# Patient Record
Sex: Male | Born: 1970 | Race: Black or African American | Hispanic: No | Marital: Single | Smoking: Current every day smoker
Health system: Southern US, Community
[De-identification: ages and names within clinical notes are randomized; demographics above are authoritative.]

## PROBLEM LIST (undated history)

## (undated) DIAGNOSIS — F419 Anxiety disorder, unspecified: Secondary | ICD-10-CM

## (undated) HISTORY — PX: NO PAST SURGERIES: SHX2092

---

## 2006-03-23 ENCOUNTER — Emergency Department (HOSPITAL_COMMUNITY): Admission: EM | Admit: 2006-03-23 | Discharge: 2006-03-23 | Payer: Self-pay | Admitting: Emergency Medicine

## 2010-08-26 ENCOUNTER — Ambulatory Visit: Payer: Self-pay | Admitting: Physical Therapy

## 2010-09-04 ENCOUNTER — Ambulatory Visit (HOSPITAL_BASED_OUTPATIENT_CLINIC_OR_DEPARTMENT_OTHER): Payer: BC Managed Care – PPO | Admitting: Psychology

## 2010-09-04 DIAGNOSIS — F332 Major depressive disorder, recurrent severe without psychotic features: Secondary | ICD-10-CM

## 2010-09-04 DIAGNOSIS — F4010 Social phobia, unspecified: Secondary | ICD-10-CM

## 2010-09-11 ENCOUNTER — Encounter (HOSPITAL_BASED_OUTPATIENT_CLINIC_OR_DEPARTMENT_OTHER): Payer: BC Managed Care – PPO | Admitting: Psychology

## 2010-09-11 DIAGNOSIS — F332 Major depressive disorder, recurrent severe without psychotic features: Secondary | ICD-10-CM

## 2010-09-11 DIAGNOSIS — F4010 Social phobia, unspecified: Secondary | ICD-10-CM

## 2010-11-02 ENCOUNTER — Ambulatory Visit (HOSPITAL_COMMUNITY): Payer: BC Managed Care – PPO | Admitting: Psychiatry

## 2010-12-11 ENCOUNTER — Ambulatory Visit (INDEPENDENT_AMBULATORY_CARE_PROVIDER_SITE_OTHER): Payer: BC Managed Care – PPO | Admitting: Psychiatry

## 2010-12-11 DIAGNOSIS — F331 Major depressive disorder, recurrent, moderate: Secondary | ICD-10-CM
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
## 2010-12-28 ENCOUNTER — Encounter (INDEPENDENT_AMBULATORY_CARE_PROVIDER_SITE_OTHER): Payer: BC Managed Care – PPO | Admitting: Psychiatry

## 2010-12-28 DIAGNOSIS — F331 Major depressive disorder, recurrent, moderate: Secondary | ICD-10-CM

## 2011-01-18 ENCOUNTER — Encounter (INDEPENDENT_AMBULATORY_CARE_PROVIDER_SITE_OTHER): Payer: BC Managed Care – PPO | Admitting: Psychiatry

## 2011-01-18 ENCOUNTER — Ambulatory Visit (HOSPITAL_COMMUNITY)
Admission: RE | Admit: 2011-01-18 | Discharge: 2011-01-18 | Disposition: A | Discharge status: Home / Self Care | Payer: BC Managed Care – PPO | Attending: Psychiatry | Admitting: Psychiatry

## 2011-01-18 DIAGNOSIS — F329 Major depressive disorder, single episode, unspecified: Secondary | ICD-10-CM

## 2011-01-18 DIAGNOSIS — F339 Major depressive disorder, recurrent, unspecified: Secondary | ICD-10-CM | POA: Insufficient documentation

## 2011-01-19 ENCOUNTER — Other Ambulatory Visit (HOSPITAL_COMMUNITY): Payer: BC Managed Care – PPO

## 2011-01-20 ENCOUNTER — Other Ambulatory Visit (HOSPITAL_COMMUNITY): Payer: BC Managed Care – PPO | Attending: Psychiatry

## 2011-01-20 DIAGNOSIS — F339 Major depressive disorder, recurrent, unspecified: Secondary | ICD-10-CM | POA: Insufficient documentation

## 2011-01-20 DIAGNOSIS — F411 Generalized anxiety disorder: Secondary | ICD-10-CM | POA: Insufficient documentation

## 2011-01-20 DIAGNOSIS — F909 Attention-deficit hyperactivity disorder, unspecified type: Secondary | ICD-10-CM

## 2011-01-20 DIAGNOSIS — F4310 Post-traumatic stress disorder, unspecified: Secondary | ICD-10-CM

## 2011-01-20 DIAGNOSIS — F341 Dysthymic disorder: Secondary | ICD-10-CM

## 2011-01-21 ENCOUNTER — Other Ambulatory Visit (HOSPITAL_COMMUNITY): Payer: BC Managed Care – PPO

## 2011-01-22 ENCOUNTER — Other Ambulatory Visit (HOSPITAL_COMMUNITY): Payer: BC Managed Care – PPO

## 2011-01-25 ENCOUNTER — Other Ambulatory Visit (HOSPITAL_COMMUNITY): Payer: BC Managed Care – PPO

## 2011-01-26 ENCOUNTER — Other Ambulatory Visit (HOSPITAL_BASED_OUTPATIENT_CLINIC_OR_DEPARTMENT_OTHER): Payer: BC Managed Care – PPO

## 2011-01-26 DIAGNOSIS — F329 Major depressive disorder, single episode, unspecified: Secondary | ICD-10-CM

## 2011-01-27 ENCOUNTER — Other Ambulatory Visit (HOSPITAL_COMMUNITY): Payer: BC Managed Care – PPO

## 2011-01-28 ENCOUNTER — Other Ambulatory Visit (HOSPITAL_COMMUNITY): Payer: BC Managed Care – PPO

## 2011-01-29 ENCOUNTER — Other Ambulatory Visit (HOSPITAL_COMMUNITY): Payer: BC Managed Care – PPO

## 2011-02-01 ENCOUNTER — Other Ambulatory Visit (HOSPITAL_COMMUNITY): Payer: BC Managed Care – PPO

## 2011-02-02 ENCOUNTER — Other Ambulatory Visit (HOSPITAL_COMMUNITY): Payer: BC Managed Care – PPO

## 2011-02-03 ENCOUNTER — Other Ambulatory Visit (HOSPITAL_COMMUNITY): Payer: BC Managed Care – PPO

## 2011-02-04 ENCOUNTER — Other Ambulatory Visit (HOSPITAL_COMMUNITY): Payer: BC Managed Care – PPO | Attending: Psychiatry

## 2011-02-04 DIAGNOSIS — F339 Major depressive disorder, recurrent, unspecified: Secondary | ICD-10-CM | POA: Insufficient documentation

## 2011-02-04 DIAGNOSIS — F411 Generalized anxiety disorder: Secondary | ICD-10-CM | POA: Insufficient documentation

## 2011-02-05 ENCOUNTER — Other Ambulatory Visit (HOSPITAL_COMMUNITY): Payer: BC Managed Care – PPO

## 2011-02-08 ENCOUNTER — Other Ambulatory Visit (HOSPITAL_COMMUNITY): Payer: BC Managed Care – PPO

## 2011-02-09 ENCOUNTER — Other Ambulatory Visit (HOSPITAL_COMMUNITY): Payer: BC Managed Care – PPO

## 2011-02-10 ENCOUNTER — Other Ambulatory Visit (HOSPITAL_COMMUNITY): Payer: BC Managed Care – PPO

## 2011-02-11 ENCOUNTER — Other Ambulatory Visit (HOSPITAL_COMMUNITY): Payer: BC Managed Care – PPO

## 2011-02-23 ENCOUNTER — Other Ambulatory Visit (HOSPITAL_COMMUNITY): Payer: Self-pay

## 2011-03-01 ENCOUNTER — Encounter (HOSPITAL_COMMUNITY): Payer: Self-pay | Admitting: Psychiatry

## 2011-03-01 ENCOUNTER — Ambulatory Visit (INDEPENDENT_AMBULATORY_CARE_PROVIDER_SITE_OTHER): Payer: BC Managed Care – PPO | Admitting: Psychiatry

## 2011-03-01 DIAGNOSIS — F329 Major depressive disorder, single episode, unspecified: Secondary | ICD-10-CM

## 2011-03-01 MED ORDER — BUPROPION HCL ER (SR) 150 MG PO TB12: 150.0000 mg | ORAL_TABLET | Freq: Two times a day (BID) | ORAL | Status: DC

## 2011-03-01 NOTE — Progress Notes (Signed)
Patient came for his followup appointment. He has recently finished intensive outpatient program. His medication has changed now he is taking Wellbutrin. He never started Prozac. He has shown some improvement but he continued to endorse decreased energy poor concentration and some depressive thoughts. He is sleeping better. He will his thought seeing therapist in this office tomorrow for individual counseling. He reported no side effects of Wellbutrin. He reported no agitation no anger or any hallucination. He wants to try higher dose of Wellbutrin. So far he had tried Cymbalta with limited response. He started to be more social and active in his life.  Mental status examination Patient is casually dressed with fair eye contact. His speech is clear and coherent. His thought process is slow but logical linear and goal-directed. He denies any active or passive suicidal thinking. There no psychotic symptoms present. His attention and concentration is fair. He's alert and oriented x3. The  Assessment Mood disorder NOS rule out Maj. depressive disorder  Plan I will increase his Wellbutrin to 150 twice a day. I explained the risk and benefits of medication in detail. She will begin his therapy with Forde Radon tomorrow. I reinforced safety plan. I will see him again in 2 months.

## 2011-03-02 ENCOUNTER — Ambulatory Visit (INDEPENDENT_AMBULATORY_CARE_PROVIDER_SITE_OTHER): Payer: BC Managed Care – PPO | Admitting: Psychology

## 2011-03-02 DIAGNOSIS — F4010 Social phobia, unspecified: Secondary | ICD-10-CM

## 2011-03-02 DIAGNOSIS — F330 Major depressive disorder, recurrent, mild: Secondary | ICD-10-CM

## 2011-03-02 NOTE — Progress Notes (Signed)
   THERAPIST PROGRESS NOTE  Session Time: 9.35am-10:20am  Participation Level: Active  Behavioral Response: Well GroomedAlertEuthymic  Type of Therapy: Individual Therapy  Treatment Goals addressed: Diagnosis: MDD, Social Anxiety and goals 1 &2.    Interventions: CBT and Strength-based  Summary: Clayton Shannon is a 40 y.o. male who presents with anxiety and depression.  Pt reported he received a lot of benefit in IOP as was able to feel free expressing things from past and getting things out that he felt he needed.  Pt however reports that he continues to struggle w/ anxiety, depression, relationships, motivation and accomplishing his goals.  Pt reported he has yet to resolve a speeding ticket for the past 1.29months in order to resolve his suspended drivers license.  Pt acknowledge some cognitive distortions of "doing nothing" and challenged some w/ identifying things he has done. Pt also created awareness that when he starts something he does in excess and not consistent w/ his identified enjoyment in change in routine. Pt for hw agreed to make contact w/ lawyers to resolve tickets and bring in materials/resources received from IOP.   Suicidal/Homicidal: Nowithout intent/plan  Therapist Response: Assessed pt current functioning per his report.  Processed w/ pt his IOP tx and benefits gained and wants for f/u tx at this time.  Explored w/ pt barriers to making change and introduced to cognitive distortions and gave pt hw to bring in materials from IOP as he reported some information on already.  Processed w/ pt cognitive distortions of all or nothing thinking and assisted in challenging in session.  Assisted pt in identifying hw to commit to actions for improvements.   Plan: Return again in 1-2 weeks.  Diagnosis: Axis I: Major Depression, Recurrent mild and Social Anxiety    Axis II: Deferred    Forde Radon, [facility] 03/02/2011

## 2011-03-16 ENCOUNTER — Ambulatory Visit (HOSPITAL_COMMUNITY): Payer: BC Managed Care – PPO | Admitting: Psychology

## 2011-03-16 DIAGNOSIS — F331 Major depressive disorder, recurrent, moderate: Secondary | ICD-10-CM

## 2011-03-16 DIAGNOSIS — F4010 Social phobia, unspecified: Secondary | ICD-10-CM

## 2011-04-26 ENCOUNTER — Ambulatory Visit (INDEPENDENT_AMBULATORY_CARE_PROVIDER_SITE_OTHER): Payer: BC Managed Care – PPO | Admitting: Psychiatry

## 2011-04-26 ENCOUNTER — Encounter (HOSPITAL_COMMUNITY): Payer: Self-pay | Admitting: Psychiatry

## 2011-04-26 VITALS — Wt 234.0 lb

## 2011-04-26 DIAGNOSIS — F329 Major depressive disorder, single episode, unspecified: Secondary | ICD-10-CM

## 2011-04-26 MED ORDER — FLUOXETINE HCL 10 MG PO CAPS: ORAL_CAPSULE | ORAL | Status: DC
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
## 2011-05-03 ENCOUNTER — Ambulatory Visit (HOSPITAL_COMMUNITY): Payer: BC Managed Care – PPO | Admitting: Psychiatry

## 2011-05-04 ENCOUNTER — Ambulatory Visit (INDEPENDENT_AMBULATORY_CARE_PROVIDER_SITE_OTHER): Payer: BC Managed Care – PPO | Admitting: Psychology

## 2011-05-04 DIAGNOSIS — F331 Major depressive disorder, recurrent, moderate: Secondary | ICD-10-CM

## 2011-05-04 DIAGNOSIS — F4310 Post-traumatic stress disorder, unspecified: Secondary | ICD-10-CM

## 2011-05-04 NOTE — Progress Notes (Signed)
   THERAPIST PROGRESS NOTE  Session Time: 1:05pm-1:55pm  Participation Level: Active  Behavioral Response: Well GroomedAlertDepressed  Type of Therapy: Individual Therapy  Treatment Goals addressed: Diagnosis: MDD and PTSD  Interventions: CBT and Strength-based  Summary: Clayton Shannon is a 41 y.o. male who presents with reported depressed mood and anxiety.  Pt reported that he has recently become more depressed again and wanting to f/u w/ tx.  Pt denies any SI.  He reports continuing to struggle w/ low self confidence, social anxiety and avoidance.  Pt is able to identify some cognitve distortions in session and talk about how he is attempting to use positive reframes.  Pt gained insight into PTSD symptoms reporting intrusive recollections w/ avoidance in relationships. Pt feels he needs concrete insturctions for making changes and be able to see these visually.  Pt aggreed to document and chart use of positive self statements till next session.  Suicidal/Homicidal: Nowithout intent/plan  Therapist Response: Assessed pt current functioning per his report.  Processed w/ pt depressive and anxious feelings and connections of thoughts and beliefs.  Discussed the process of change, effective treatments for depression and anxiety and pt role in tx.  Encouraged pt charting and documentation of agreed positive self statements daily.  Plan: Return again in 2 weeks.  Diagnosis: Axis I: Major Depression, Recurrent  and Post Traumatic Stress Disorder    Axis II: No diagnosis    Kaity Pitstick, LPC 05/04/2011

## 2011-05-18 ENCOUNTER — Ambulatory Visit (HOSPITAL_COMMUNITY): Payer: BC Managed Care – PPO | Admitting: Psychology

## 2011-07-22 ENCOUNTER — Encounter (HOSPITAL_COMMUNITY): Payer: Self-pay | Admitting: Psychology

## 2011-07-22 DIAGNOSIS — F331 Major depressive disorder, recurrent, moderate: Secondary | ICD-10-CM

## 2011-07-22 NOTE — Progress Notes (Signed)
Outpatient Therapist Discharge Summary  Clayton Shannon    May 20, 1970   Admission Date: 6/112   Discharge Date:  07/22/11 Reason for Discharge:  Not active- no shows Diagnosis:  Axis I:   1. Major depressive disorder, recurrent episode, moderate     Axis II:  v71.09  Axis III:  none  Axis IV:  Primary supports, occupational, social  Axis V:  Unknown on d/c  Comments:  Pt last attended tx on 05/04/11- no show for f/u  Forde Radon

## 2014-11-22 ENCOUNTER — Encounter (HOSPITAL_COMMUNITY): Payer: Self-pay

## 2014-11-22 ENCOUNTER — Emergency Department (HOSPITAL_COMMUNITY)
Admission: EM | Admit: 2014-11-22 | Discharge: 2014-11-22 | Disposition: A | Discharge status: Home / Self Care | Payer: Managed Care, Other (non HMO) | Source: Home / Self Care | Attending: Emergency Medicine | Admitting: Emergency Medicine

## 2014-11-22 ENCOUNTER — Other Ambulatory Visit (HOSPITAL_COMMUNITY)
Admission: RE | Admit: 2014-11-22 | Discharge: 2014-11-22 | Disposition: A | Discharge status: Home / Self Care | Payer: Managed Care, Other (non HMO) | Source: Ambulatory Visit | Attending: Emergency Medicine | Admitting: Emergency Medicine

## 2014-11-22 DIAGNOSIS — R300 Dysuria: Secondary | ICD-10-CM | POA: Diagnosis not present

## 2014-11-22 DIAGNOSIS — Z113 Encounter for screening for infections with a predominantly sexual mode of transmission: Secondary | ICD-10-CM | POA: Insufficient documentation

## 2014-11-22 DIAGNOSIS — R1011 Right upper quadrant pain: Secondary | ICD-10-CM

## 2014-11-22 DIAGNOSIS — R1010 Upper abdominal pain, unspecified: Secondary | ICD-10-CM | POA: Diagnosis not present

## 2014-11-22 LAB — COMPREHENSIVE METABOLIC PANEL
ALT: 16 U/L — ABNORMAL LOW (ref 17–63)
ANION GAP: 8 (ref 5–15)
AST: 18 U/L (ref 15–41)
Albumin: 4.4 g/dL (ref 3.5–5.0)
Alkaline Phosphatase: 38 U/L (ref 38–126)
BILIRUBIN TOTAL: 0.5 mg/dL (ref 0.3–1.2)
BUN: 12 mg/dL (ref 6–20)
CHLORIDE: 104 mmol/L (ref 101–111)
CO2: 27 mmol/L (ref 22–32)
Calcium: 9.7 mg/dL (ref 8.9–10.3)
Creatinine, Ser: 1.24 mg/dL (ref 0.61–1.24)
GFR calc non Af Amer: >60 mL/min (ref >60)
Glucose, Bld: 113 mg/dL — ABNORMAL HIGH (ref 65–99)
POTASSIUM: 4.2 mmol/L (ref 3.5–5.1)
Sodium: 139 mmol/L (ref 135–145)
TOTAL PROTEIN: 7.6 g/dL (ref 6.5–8.1)

## 2014-11-22 LAB — CBC
HCT: 41.1 % (ref 39.0–52.0)
Hemoglobin: 14.1 g/dL (ref 13.0–17.0)
MCH: 30.5 pg (ref 26.0–34.0)
MCHC: 34.3 g/dL (ref 30.0–36.0)
MCV: 89 fL (ref 78.0–100.0)
PLATELETS: 215 10*3/uL (ref 150–400)
RBC: 4.62 MIL/uL (ref 4.22–5.81)
RDW: 12.5 % (ref 11.5–15.5)
WBC: 6.2 10*3/uL (ref 4.0–10.5)

## 2014-11-22 LAB — POCT URINALYSIS DIP (DEVICE)
BILIRUBIN URINE: NEGATIVE
GLUCOSE, UA: NEGATIVE mg/dL
Ketones, ur: NEGATIVE mg/dL
NITRITE: NEGATIVE
Protein, ur: NEGATIVE mg/dL
Specific Gravity, Urine: 1.02 (ref 1.005–1.030)
UROBILINOGEN UA: 0.2 mg/dL (ref 0.0–1.0)
pH: 7 (ref 5.0–8.0)

## 2014-11-22 MED ORDER — CEFTRIAXONE SODIUM 250 MG IJ SOLR
INTRAMUSCULAR | Status: AC
  Filled 2014-11-22: qty 250

## 2014-11-22 MED ORDER — LIDOCAINE HCL (PF) 1 % IJ SOLN
INTRAMUSCULAR | Status: AC
  Filled 2014-11-22: qty 5

## 2014-11-22 MED ORDER — AZITHROMYCIN 250 MG PO TABS
1000.0000 mg | ORAL_TABLET | Freq: Once | ORAL | Status: AC
  Administered 2014-11-22: 1000 mg via ORAL

## 2014-11-22 MED ORDER — CEFTRIAXONE SODIUM 250 MG IJ SOLR
250.0000 mg | Freq: Once | INTRAMUSCULAR | Status: AC
  Administered 2014-11-22: 250 mg via INTRAMUSCULAR

## 2014-11-22 MED ORDER — AZITHROMYCIN 250 MG PO TABS
ORAL_TABLET | ORAL | Status: AC
  Filled 2014-11-22: qty 4

## 2014-11-22 NOTE — ED Provider Notes (Signed)
CSN: [id number]     Arrival date & time 11/22/14  1619 History   First MD Initiated Contact with Patient 11/22/14 1712     Chief Complaint  Patient presents with  . Abdominal Pain   (Consider location/radiation/quality/duration/timing/severity/associated sxs/prior Treatment) HPI He is a 44 year old man here for evaluation of right upper quadrant abdominal discomfort as well as a tingling sensation in his penis. The tingling sensations started about a week ago. He denies any discharge or dysuria. He did have unprotected sex with a new partner about one month ago. The right upper quadrant abdominal discomfort is described as a pressure sensation. It has been present for 5 years. It is constant. It is not worsening. He was evaluated by a physician for this several years ago, but states no blood work or imaging was done. He is a current smoker. He drinks one or 2 beers on the weekend. He denies any history of drug use. No history of blood transfusions.  History reviewed. No pertinent past medical history. History reviewed. No pertinent past surgical history. History reviewed. No pertinent family history. Social History  Substance Use Topics  . Smoking status: Current Every Day Smoker -- 0.50 packs/day for 25 years    Types: Cigarettes  . Smokeless tobacco: None  . Alcohol Use: No    Review of Systems As in history of present illness Allergies  Review of patient's allergies indicates no known allergies.  Home Medications   Prior to Admission medications   Medication Sig Start Date End Date Taking? Authorizing Provider  FLUoxetine (PROZAC) 10 MG capsule Take 1 cap every day for one week and than 2 cap a day 04/26/11   Cleotis Nipper, MD   BP 129/77 mmHg  Pulse 83  Temp(Src) 98.4 F (36.9 C) (Oral)  Resp 16  SpO2 97% Physical Exam  Constitutional: He is oriented to person, place, and time. He appears well-developed and well-nourished. No distress.  Neck: Neck supple.  Cardiovascular:  Normal rate.   Pulmonary/Chest: Effort normal.  Abdominal: Soft. Bowel sounds are normal. He exhibits no distension and no mass. There is no tenderness. There is no rebound and no guarding.  No hepatomegaly  Neurological: He is alert and oriented to person, place, and time.    ED Course  Procedures (including critical care time) Labs Review Labs Reviewed  POCT URINALYSIS DIP (DEVICE) - Abnormal; Notable for the following:    Hgb urine dipstick TRACE (*)    Leukocytes, UA SMALL (*)    All other components within normal limits  CBC  COMPREHENSIVE METABOLIC PANEL  HIV ANTIBODY (ROUTINE TESTING)  RPR  HEPATITIS PANEL, ACUTE  URINE CYTOLOGY ANCILLARY ONLY    Imaging Review No results found.   MDM   1. RUQ discomfort   2. Dysuria    Blood and urine collected. We will call with any positive results. Treated presumptively with Rocephin 250 mg IM and azithromycin 1 g by mouth. Right upper quadrant discomfort may be due to liver pathology. CMP and hepatitis panel sent. Recommended establishing with a PCP who can follow up the abdominal pain and his chronic back pain.    Charm Rings, MD 11/22/14 [phone]

## 2014-11-22 NOTE — ED Notes (Addendum)
C/o constant pressure type discomfort x 5 YEARS. No one has been able to tell him what this is. Later in interview w MD , pt c/o STD concerns and tingling w urination

## 2014-11-22 NOTE — Discharge Instructions (Signed)
We collected blood and urine for testing today. If anything is abnormal we will call you. This will take 2-3 days. We treated you presumptively for gonorrhea and Chlamydia today. No sex for 1 week. Please find a primary care physician who can follow the abdominal discomfort.

## 2014-11-23 LAB — HIV ANTIBODY (ROUTINE TESTING W REFLEX): HIV SCREEN 4TH GENERATION: NONREACTIVE

## 2014-11-23 LAB — RPR: RPR: NONREACTIVE

## 2014-11-24 LAB — HEPATITIS PANEL, ACUTE
HCV Ab: <0.1 s/co ratio (ref 0.0–0.9)
HEP A IGM: NEGATIVE
HEP B C IGM: NEGATIVE
HEP B S AG: NEGATIVE

## 2014-11-25 LAB — URINE CYTOLOGY ANCILLARY ONLY
Chlamydia: NEGATIVE
NEISSERIA GONORRHEA: NEGATIVE
TRICH (WINDOWPATH): NEGATIVE

## 2014-11-25 NOTE — ED Notes (Signed)
HIV, RPR, hepatitis panel are negative. Still waiting on UA cytology report

## 2014-11-26 NOTE — ED Notes (Signed)
UA cytology reports are all negative

## 2016-03-11 ENCOUNTER — Ambulatory Visit (HOSPITAL_COMMUNITY)
Admission: EM | Admit: 2016-03-11 | Discharge: 2016-03-11 | Disposition: A | Discharge status: Home / Self Care | Payer: Managed Care, Other (non HMO) | Attending: Emergency Medicine | Admitting: Emergency Medicine

## 2016-03-11 ENCOUNTER — Encounter (HOSPITAL_COMMUNITY): Payer: Self-pay | Admitting: Family Medicine

## 2016-03-11 DIAGNOSIS — R1031 Right lower quadrant pain: Secondary | ICD-10-CM | POA: Diagnosis not present

## 2016-03-11 DIAGNOSIS — G8929 Other chronic pain: Secondary | ICD-10-CM | POA: Diagnosis not present

## 2016-03-11 DIAGNOSIS — M545 Low back pain, unspecified: Secondary | ICD-10-CM

## 2016-03-11 NOTE — ED Provider Notes (Signed)
CSN: [id number]     Arrival date & time 03/11/16  1054 History   First MD Initiated Contact with Patient 03/11/16 1141     Chief Complaint  Patient presents with  . Groin Pain   (Consider location/radiation/quality/duration/timing/severity/associated sxs/prior Treatment) 45 year old male presents to the urgent care prompted by pain to the right lower most quadrant that occurred this morning. This is different and just a few centimeters distal to the chronic pain that he has intermittently in the right lower quadrant for several months. All of these areas of pain are elicited by certain movements. He also has a well localized area pain in his left low back. This too is elicited by certain movements. Denies any known injury. Nursing notes states that he helped someone bring a tree into the house yesterday. He does not recall a single event that cause sudden acute pain.      History reviewed. No pertinent past medical history. History reviewed. No pertinent surgical history. History reviewed. No pertinent family history. Social History  Substance Use Topics  . Smoking status: Current Every Day Smoker    Packs/day: 0.50    Years: 25.00    Types: Cigarettes  . Smokeless tobacco: Never Used  . Alcohol use No    Review of Systems  Constitutional: Negative for activity change, fatigue and fever.  HENT: Negative.   Respiratory: Negative.  Negative for cough, chest tightness and shortness of breath.   Cardiovascular: Negative for chest pain and leg swelling.  Gastrointestinal: Negative for abdominal distention, abdominal pain, blood in stool, constipation, diarrhea, nausea, rectal pain and vomiting.  Genitourinary: Negative.  Negative for discharge, dysuria, frequency, hematuria, penile pain, penile swelling, scrotal swelling, testicular pain and urgency.  Musculoskeletal: Positive for back pain and myalgias.  Skin: Negative.   Neurological: Negative.   Psychiatric/Behavioral: Negative for  confusion.  All other systems reviewed and are negative.   Allergies  Patient has no known allergies.  Home Medications   Prior to Admission medications   Medication Sig Start Date End Date Taking? Authorizing Provider  FLUoxetine (PROZAC) 10 MG capsule Take 1 cap every day for one week and than 2 cap a day 04/26/11   Cleotis NipperSyed T Arfeen, MD   Meds Ordered and Administered this Visit  Medications - No data to display  BP 115/71   Pulse 62   Temp 98 F (36.7 C)   Resp 18   SpO2 96%  No data found.   Physical Exam  Constitutional: He is oriented to person, place, and time. He appears well-developed and well-nourished. No distress.  Neck: Neck supple.  Cardiovascular: Normal rate.   Pulmonary/Chest: Effort normal and breath sounds normal.  Abdominal: Soft. Bowel sounds are normal.  Very low right lower quadrant/inguinal tenderness. Difficult to consistently palpate an area to identify tenderness. Performing a straight leg raise as well as abduction does not create, illicit any sort of inguinal pain. While supine no evidence of hernia. Patient states upon standing and bending forward the pain is worse. He is describing the new pain that started this morning. Examination while standing reveals tenderness in the right inguina however no inguinal ring is palpated, no hernia. The patient was bending over and performing various movements and could not reproduce the pain. No bulging, no muscle tenderness identified.  Genitourinary:  Genitourinary Comments: Normal external male genitalia. No evidence of hernia.  Musculoskeletal:  Well localized left paralumbar muscle tenderness. Pain is worse with bending over and pulling. No spinal tenderness.  Neurological:  He is alert and oriented to person, place, and time. Coordination normal.  Skin: Skin is warm and dry.  Psychiatric: He has a normal mood and affect. His behavior is normal.  Nursing note and vitals reviewed.   Urgent Care Course    Clinical Course     Procedures (including critical care time)  Labs Review Labs Reviewed - No data to display  Imaging Review No results found.   Visual Acuity Review  Right Eye Distance:   Left Eye Distance:   Bilateral Distance:    Right Eye Near:   Left Eye Near:    Bilateral Near:         MDM   1. Inguinal pain, right   2. Chronic RLQ pain   3. Chronic left-sided low back pain without sciatica    I suspect that you are having muscle pain in the right inguinal area. The pain that she been having off and on in the right lower area may be due to a deeper hernia injury. On today's exam unable to actually palpate a hernia rate or evidence of a hernia however the location and the description suggests that this may be the case. There may also be some muscle strain as well as. Recommend no heavy lifting, pulling or pushing or straining. Your back pain is most likely due to muscle strain as well. Applied heat and perform stretches as demonstrated. We will need to follow-up with the primary care provider. I suspect that you we will probably have to have imaging studies to better identify the cause for your pain. If he developed sudden acute or severe pain, nausea and vomiting or fevers or any other type of worsening or limes in the area of pain directly to the emergency department    Hayden Rasmussenavid  [place], NP 03/11/16 1224

## 2016-03-11 NOTE — ED Triage Notes (Signed)
Pt here for right groin pain that started today. sts he did help someone carry a christmas tree in her house yesterday. sts he has been having RLQ pain for months. sts also LL back pain for months.

## 2016-03-11 NOTE — Discharge Instructions (Signed)
I suspect that you are having muscle pain in the right inguinal area. The pain that she been having off and on in the right lower area may be due to a deeper hernia injury. On today's exam unable to actually palpate a hernia rate or evidence of a hernia however the location and the description suggests that this may be the case. There may also be some muscle strain as well as. Recommend no heavy lifting, pulling or pushing or straining. Your back pain is most likely due to muscle strain as well. Applied heat and perform stretches as demonstrated. We will need to follow-up with the primary care provider. I suspect that you we will probably have to have imaging studies to better identify the cause for your pain. If he developed sudden acute or severe pain, nausea and vomiting or fevers or any other type of worsening or limes in the area of pain directly to the emergency department

## 2016-03-12 ENCOUNTER — Encounter (HOSPITAL_COMMUNITY): Payer: Self-pay | Admitting: Emergency Medicine

## 2016-03-12 ENCOUNTER — Emergency Department (HOSPITAL_COMMUNITY): Payer: Managed Care, Other (non HMO)

## 2016-03-12 ENCOUNTER — Emergency Department (HOSPITAL_COMMUNITY)
Admission: EM | Admit: 2016-03-12 | Discharge: 2016-03-12 | Disposition: A | Discharge status: Home / Self Care | Payer: Managed Care, Other (non HMO) | Attending: Emergency Medicine | Admitting: Emergency Medicine

## 2016-03-12 DIAGNOSIS — F17210 Nicotine dependence, cigarettes, uncomplicated: Secondary | ICD-10-CM | POA: Insufficient documentation

## 2016-03-12 DIAGNOSIS — R1031 Right lower quadrant pain: Secondary | ICD-10-CM | POA: Insufficient documentation

## 2016-03-12 DIAGNOSIS — R1030 Lower abdominal pain, unspecified: Secondary | ICD-10-CM

## 2016-03-12 HISTORY — DX: Anxiety disorder, unspecified: F41.9

## 2016-03-12 LAB — CBC
HEMATOCRIT: 44.1 % (ref 39.0–52.0)
HEMOGLOBIN: 15.3 g/dL (ref 13.0–17.0)
MCH: 30.9 pg (ref 26.0–34.0)
MCHC: 34.7 g/dL (ref 30.0–36.0)
MCV: 89.1 fL (ref 78.0–100.0)
Platelets: 226 10*3/uL (ref 150–400)
RBC: 4.95 MIL/uL (ref 4.22–5.81)
RDW: 12.2 % (ref 11.5–15.5)
WBC: 3.5 10*3/uL — AB (ref 4.0–10.5)

## 2016-03-12 LAB — COMPREHENSIVE METABOLIC PANEL
ALT: 31 U/L (ref 17–63)
ANION GAP: 8 (ref 5–15)
AST: 23 U/L (ref 15–41)
Albumin: 4.5 g/dL (ref 3.5–5.0)
Alkaline Phosphatase: 48 U/L (ref 38–126)
BUN: 9 mg/dL (ref 6–20)
CHLORIDE: 103 mmol/L (ref 101–111)
CO2: 27 mmol/L (ref 22–32)
CREATININE: 1.35 mg/dL — AB (ref 0.61–1.24)
Calcium: 9.9 mg/dL (ref 8.9–10.3)
Glucose, Bld: 107 mg/dL — ABNORMAL HIGH (ref 65–99)
POTASSIUM: 4.6 mmol/L (ref 3.5–5.1)
SODIUM: 138 mmol/L (ref 135–145)
Total Bilirubin: 0.8 mg/dL (ref 0.3–1.2)
Total Protein: 7.6 g/dL (ref 6.5–8.1)

## 2016-03-12 LAB — URINALYSIS, ROUTINE W REFLEX MICROSCOPIC
Bilirubin Urine: NEGATIVE
GLUCOSE, UA: NEGATIVE mg/dL
HGB URINE DIPSTICK: NEGATIVE
Ketones, ur: NEGATIVE mg/dL
LEUKOCYTES UA: NEGATIVE
Nitrite: NEGATIVE
PROTEIN: NEGATIVE mg/dL
SPECIFIC GRAVITY, URINE: 1.015 (ref 1.005–1.030)
pH: 6 (ref 5.0–8.0)

## 2016-03-12 LAB — LIPASE, BLOOD: LIPASE: 21 U/L (ref 11–51)

## 2016-03-12 MED ORDER — KETOROLAC TROMETHAMINE 30 MG/ML IJ SOLN
30.0000 mg | Freq: Once | INTRAMUSCULAR | Status: AC
  Administered 2016-03-12: 30 mg via INTRAVENOUS
  Filled 2016-03-12: qty 1

## 2016-03-12 MED ORDER — IOPAMIDOL (ISOVUE-300) INJECTION 61%
INTRAVENOUS | Status: AC
  Administered 2016-03-12: 100 mL
  Filled 2016-03-12: qty 100

## 2016-03-12 MED ORDER — CYCLOBENZAPRINE HCL 5 MG PO TABS: 5.0000 mg | ORAL_TABLET | Freq: Three times a day (TID) | ORAL | 0 refills | Status: DC | PRN

## 2016-03-12 MED ORDER — SODIUM CHLORIDE 0.9 % IV BOLUS (SEPSIS)
1000.0000 mL | Freq: Once | INTRAVENOUS | Status: AC
  Administered 2016-03-12: 1000 mL via INTRAVENOUS

## 2016-03-12 NOTE — ED Provider Notes (Signed)
[facility]-EMERGENCY DEPT Provider Note   CSN: [id number] Arrival date & time: 03/12/16  1132     History   Chief Complaint Chief Complaint  Patient presents with  . Abdominal Pain    HPI Clayton Shannon is a 45 y.o. male hx of anxiety, Who presenting with persistent lower abdominal pain. Patient has been having right lower quadrant pain about 3-4 days. Patient went to see urgent care yesterday and was diagnosed with possible muscle strain vs small hernia. He was referred to outpatient clinic but was unable to get an appointment for another month. Patient states that intermittently when he moves he has pain in the right lower quadrant. Denies any nausea or vomiting. Chronic constipation that is unchanged and last bowel movement was yesterday. Denies any fevers or chills or urinary symptoms.     The history is provided by the patient.    Past Medical History:  Diagnosis Date  . Anxiety     There are no active problems to display for this patient.   History reviewed. No pertinent surgical history.     Home Medications    Prior to Admission medications   Medication Sig Start Date End Date Taking? Authorizing Provider  Multiple Vitamin (MULTIVITAMIN WITH MINERALS) TABS tablet Take 1 tablet by mouth daily.   Yes Historical Provider, MD  FLUoxetine (PROZAC) 10 MG capsule Take 1 cap every day for one week and than 2 cap a day Patient not taking: Reported on 03/12/2016 04/26/11   Cleotis NipperSyed T Arfeen, MD    Family History No family history on file.  Social History Social History  Substance Use Topics  . Smoking status: Current Every Day Smoker    Packs/day: 0.50    Years: 25.00    Types: Cigarettes  . Smokeless tobacco: Never Used  . Alcohol use No     Allergies   Patient has no known allergies.   Review of Systems Review of Systems  Gastrointestinal: Positive for abdominal pain.  All other systems reviewed and are negative.    Physical Exam Updated Vital Signs BP (!)  119/47   Pulse 73   Temp 98.6 F (37 C) (Oral)   Resp 16   SpO2 97%   Physical Exam  Constitutional: He is oriented to person, place, and time. He appears well-developed and well-nourished.  HENT:  Head: Normocephalic.  Mouth/Throat: Oropharynx is clear and moist.  Eyes: EOM are normal. Pupils are equal, round, and reactive to light.  Neck: Normal range of motion. Neck supple.  Cardiovascular: Normal rate, regular rhythm and normal heart sounds.   Pulmonary/Chest: Effort normal and breath sounds normal. No respiratory distress. He has no wheezes. He has no rales.  Abdominal: Soft. Bowel sounds are normal.  + mild R inguinal tenderness. ? Small inguinal hernia.   Genitourinary:  Genitourinary Comments: Testicles nontender   Musculoskeletal: Normal range of motion.  Neurological: He is alert and oriented to person, place, and time.  Skin: Skin is warm.  Psychiatric: He has a normal mood and affect.  Nursing note and vitals reviewed.    ED Treatments / Results  Labs (all labs ordered are listed, but only abnormal results are displayed) Labs Reviewed  COMPREHENSIVE METABOLIC PANEL - Abnormal; Notable for the following:       Result Value   Glucose, Bld 107 (*)    Creatinine, Ser 1.35 (*)    All other components within normal limits  CBC - Abnormal; Notable for the following:    WBC 3.5 (*)  All other components within normal limits  LIPASE, BLOOD  URINALYSIS, ROUTINE W REFLEX MICROSCOPIC    EKG  EKG Interpretation None       Radiology Ct Abdomen Pelvis W Contrast  Result Date: 03/12/2016 CLINICAL DATA:  Right groin pain x 3-4 months EXAM: CT ABDOMEN AND PELVIS WITH CONTRAST TECHNIQUE: Multidetector CT imaging of the abdomen and pelvis was performed using the standard protocol following bolus administration of intravenous contrast. CONTRAST:  100mL ISOVUE-300 IOPAMIDOL (ISOVUE-300) INJECTION 61% COMPARISON:  None. FINDINGS: Lower chest: Lung bases are clear.  Hepatobiliary: Liver is within normal limits. Gallbladder is unremarkable. No intrahepatic or extrahepatic ductal dilatation. Pancreas: Within normal limits. Spleen: Within normal limits. Adrenals/Urinary Tract: Adrenal glands are within normal limits. Kidneys are within normal limits.  No hydronephrosis. Bladder is within normal limits. Stomach/Bowel: Stomach is within normal limits. No evidence of bowel obstruction. Normal appendix (series 2/ image 49). Vascular/Lymphatic: No evidence of abdominal aortic aneurysm. No suspicious abdominopelvic lymphadenopathy. Reproductive: Prostate is unremarkable. Other: No abdominopelvic ascites. No evidence of inguinal hernia. Musculoskeletal: Visualized osseous structures are within normal limits. IMPRESSION: Negative CT abdomen/pelvis. Electronically Signed   By: Charline BillsSriyesh  Krishnan M.D.   On: 03/12/2016 18:21    Procedures Procedures (including critical care time)  Medications Ordered in ED Medications  sodium chloride 0.9 % bolus 1,000 mL (0 mLs Intravenous Stopped 03/12/16 1848)  ketorolac (TORADOL) 30 MG/ML injection 30 mg (30 mg Intravenous Given 03/12/16 1641)  iopamidol (ISOVUE-300) 61 % injection (100 mLs  Contrast Given 03/12/16 1758)     Initial Impression / Assessment and Plan / ED Course  I have reviewed the triage vital signs and the nursing notes.  Pertinent labs & imaging results that were available during my care of the patient were reviewed by me and considered in my medical decision making (see chart for details).  Clinical Course     Clayton Shannon is a 45 y.o. male here with RLQ pain. Consider inguinal hernia vs MSK vs appy. Will get labs, UA, CT ab/pel.   7:21 PM Labs unremarkable. UA nl. CT showed no appy or hernia. Likely muscle strain. Will dc home with motrin, flexeril    Final Clinical Impressions(s) / ED Diagnoses   Final diagnoses:  None    New Prescriptions New Prescriptions   No medications on file     Charlynne Panderavid  Hsienta Yao, MD 03/12/16 Ernestina [id number]

## 2016-03-12 NOTE — Discharge Instructions (Signed)
Take motrin for pain  Take flexeril for muscle strain.   No heavy lifting for 3-4 days   See your doctor   Return to ER if you have severe abdominal pain, vomiting, fever.

## 2016-03-12 NOTE — ED Notes (Signed)
Pt. Ambulatory to restroom at this time with steady gait

## 2016-03-12 NOTE — ED Triage Notes (Addendum)
RT SIDED abd pain for a few days [facility] and states needs xrays could not get into pcp , denies dysuria n/v

## 2016-03-12 NOTE — ED Notes (Signed)
Pt departed in NAD, refused use of wheelchair.  

## 2016-04-25 NOTE — Progress Notes (Signed)
Subjective:    Patient ID: Clayton Shannon, male    DOB: [birth year], 46 y.o.   MRN: [id number]  HPI He is here to establish with a new pcp.     Lower back pain:  It started in his 20's.  The pain is across the lower back but focused in the left lower back.  There is no pain in the legs.  He has occasionally electric shocking pain in his toes.  He takes advil or aleve and it no longer helps.  He has daily back pain.  The pain is about 5-6/10, but can get up to 10/10.  The pain may be worse with inactivity.  He has been to urgent care on occasion when the pain is severe.   LLQ pain:  The pain started acutely.  He had pain with walking and it was worse with going up and down stairs. He does a lot of lifting at work.  The pain is less now, but still there. It is worse with pushing and pulling something.  He denies any bulge or lump in the lower abdomen or groin.   When he was a teenager he jammed his toe.  He started having right big toe pain for 6-7 years and he wonders if he actually broke his toe when he was a teenager.  It is worse in the monring when he is still stiff or when stretching.    He denies swelling or redness.  The pain can be 10/10 when severe.   He is smoking but is working on quitting.  He uses the vapor cig.   Anxiety: he has a history of anxiety and has seen a therapist in the past.  He wonders if he should see someone again.  He has had fleeting thoughts of hurting himself, but denies any active thoughts of hurting himself.   Medications and allergies reviewed with patient and updated if appropriate.  Patient Active Problem List   Diagnosis Date Noted  . Anxiety 04/26/2016    Current Outpatient Prescriptions on File Prior to Visit  Medication Sig Dispense Refill  . Multiple Vitamin (MULTIVITAMIN WITH MINERALS) TABS tablet Take 1 tablet by mouth daily.     No current facility-administered medications on file prior to visit.     Past Medical History:  Diagnosis Date    . Anxiety     Past Surgical History:  Procedure Laterality Date  . NO PAST SURGERIES      Social History   Social History  . Marital status: Single    Spouse name: N/A  . Number of children: N/A  . Years of education: N/A   Social History Main Topics  . Smoking status: Current Every Day Smoker    Packs/day: 0.50    Years: 25.00    Types: Cigarettes  . Smokeless tobacco: Never Used  . Alcohol use Yes     Comment: 2 times a month - 2 beers  . Drug use: No  . Sexual activity: Not Asked   Other Topics Concern  . None   Social History Narrative  . None    Family History  Problem Relation Age of Onset  . Cancer Mother     Breast  . Alzheimer's disease Paternal Grandmother   . Diabetes Paternal Grandmother     Review of Systems  Constitutional: Negative for chills and fever.  Respiratory: Positive for cough (for one week - ? URI). Negative for shortness of breath and wheezing.   Cardiovascular:  Positive for palpitations (when trying to go to sleep). Negative for chest pain.  Gastrointestinal: Positive for abdominal pain (LLQ or groin), constipation and diarrhea (BM alternate between diarhea and constipation). Negative for blood in stool and nausea.       No gerd  Genitourinary: Negative for difficulty urinating, dysuria, hematuria and testicular pain.  Musculoskeletal: Positive for arthralgias and back pain.  Skin: Negative for color change and rash.  Neurological: Negative for light-headedness and headaches.  Psychiatric/Behavioral: Positive for dysphoric mood. The patient is nervous/anxious.        Fleeting thoughts of hurting self - no active plan       Objective:   Vitals:   04/26/16 1029  BP: 116/74  Pulse: 77  Resp: 16  Temp: 98 F (36.7 C)   Filed Weights   04/26/16 1029  Weight: 256 lb (116.1 kg)   Body mass index is 32 kg/m.  Wt Readings from Last 3 Encounters:  04/26/16 256 lb (116.1 kg)     Physical Exam  Constitutional: He is  oriented to person, place, and time. He appears well-developed and well-nourished. No distress.  HENT:  Head: Normocephalic and atraumatic.  Right Ear: External ear normal.  Left Ear: External ear normal.  Mouth/Throat: Oropharynx is clear and moist.  Eyes: Conjunctivae are normal.  Neck: Neck supple. No tracheal deviation present. No thyromegaly present.  Cardiovascular: Normal rate, regular rhythm and normal heart sounds.   No murmur heard. Pulmonary/Chest: Effort normal and breath sounds normal. No respiratory distress. He has no wheezes. He has no rales.  Abdominal: Soft. He exhibits no distension and no mass. There is no tenderness. There is no rebound and no guarding.  Genitourinary:  Genitourinary Comments: deferred  Musculoskeletal: He exhibits no edema.  Lymphadenopathy:    He has no cervical adenopathy.  Neurological: He is alert and oriented to person, place, and time.  Skin: Skin is warm and dry. He is not diaphoretic.  Psychiatric: He has a normal mood and affect. His behavior is normal. Judgment and thought content normal.          Assessment & Plan:   Flu and tetanus vaccines today  See Problem List for Assessment and Plan of chronic medical problems.   FU annually

## 2016-04-26 ENCOUNTER — Ambulatory Visit (INDEPENDENT_AMBULATORY_CARE_PROVIDER_SITE_OTHER): Payer: Managed Care, Other (non HMO) | Admitting: Internal Medicine

## 2016-04-26 ENCOUNTER — Encounter: Payer: Self-pay | Admitting: Emergency Medicine

## 2016-04-26 ENCOUNTER — Ambulatory Visit (INDEPENDENT_AMBULATORY_CARE_PROVIDER_SITE_OTHER)
Admission: RE | Admit: 2016-04-26 | Discharge: 2016-04-26 | Disposition: A | Discharge status: Home / Self Care | Payer: Managed Care, Other (non HMO) | Source: Ambulatory Visit | Attending: Internal Medicine | Admitting: Internal Medicine

## 2016-04-26 ENCOUNTER — Encounter: Payer: Self-pay | Admitting: Internal Medicine

## 2016-04-26 VITALS — BP 116/74 | HR 77 | Temp 98.0°F | Resp 16 | Ht 75.0 in | Wt 256.0 lb

## 2016-04-26 DIAGNOSIS — Z720 Tobacco use: Secondary | ICD-10-CM

## 2016-04-26 DIAGNOSIS — M79674 Pain in right toe(s): Secondary | ICD-10-CM

## 2016-04-26 DIAGNOSIS — Z23 Encounter for immunization: Secondary | ICD-10-CM

## 2016-04-26 DIAGNOSIS — F419 Anxiety disorder, unspecified: Secondary | ICD-10-CM | POA: Insufficient documentation

## 2016-04-26 DIAGNOSIS — M545 Low back pain, unspecified: Secondary | ICD-10-CM

## 2016-04-26 DIAGNOSIS — G8929 Other chronic pain: Secondary | ICD-10-CM | POA: Insufficient documentation

## 2016-04-26 NOTE — Progress Notes (Signed)
Pre visit review using our clinic review tool, if applicable. No additional management support is needed unless otherwise documented below in the visit note. 

## 2016-04-26 NOTE — Patient Instructions (Addendum)
Dr Emerson MonteParrish McKinney [address] [place], [place] [address] [phone]  [facility]  [address] [place], [place] [phone]  [facility]          [facility] [address] [place], [place] [phone]  Dr Lawerance Bachupinder Maryan RuedKaur [facility] [address] [place], [place] [address] [phone]  [facility]    - walk ins only for initial viist   M-F  8am - 3pm [address] [place], [place]  [phone]    Test(s) ordered today. Your results will be released to MyChart (or called to you) after review, usually within 72hours after test completion. If any changes need to be made, you will be notified at that same time.  All other Health Maintenance issues reviewed.   All recommended immunizations and age-appropriate screenings are up-to-date or discussed.  Flu and tetanus administered today.   Medications reviewed and updated.  No changes recommended at this time.   A referral was ordered for Dr Katrinka BlazingSmith  Please followup annually for a physical

## 2016-04-26 NOTE — Assessment & Plan Note (Signed)
Chronic Possible arthritis, no signs of radiculopathy Will check xray today Will refer to Dr Katrinka BlazingSmith for further evaluation and treatment nsaids as needed

## 2016-04-26 NOTE — Assessment & Plan Note (Signed)
Chronic Likely osteoarthritis Will check xray Will refer to Dr Katrinka BlazingSmith

## 2016-04-26 NOTE — Assessment & Plan Note (Signed)
Discussed smoking cessation He is using a vapor cig and will continue to use that.

## 2016-04-26 NOTE — Assessment & Plan Note (Signed)
Feels anxious and depressed at times Interested in seeing a therapist Has had fleeting thoughts of harming himself - no active thoughts now List of therapists/ psychiatrists given

## 2016-05-05 NOTE — Progress Notes (Signed)
[facility] [address] [place], [place] [address] Phone: [phone] Subjective:    I'm seeing this patient by the request  of:  Pincus Sanes, MD   CC: Low back pain   [id number]  Clayton Shannon is a 46 y.o. male coming in with complaint of low back pain and right toe pain.  Regarding patient's low back pain he has had this for greater than 20 years. Patient states the pain seems to be always in the lower back and mostly on the left side. Sometimes states that there seems to be an electricity that can go to his toes but this is very intermittent. Patient was taking over-the-counter medications a seem to be beneficial but no longer seems to be helping. States that he had some type of back pain daily. Rates the severity of pain on a regular basis of 5 out of 10 but can be unrelenting and debilitating from time to time. Seems to get worse with inactivity and better with activity.    x-rays were independently visualized by me. X-rays taken June 22 of the lumbar spine is unremarkable for any bony abnormality. Right foot x-ray show the patient does have arthritic changes at the first metatarsal joint as well as the metatarsophalangeal joint.  Past Medical History:  Diagnosis Date  . Anxiety    Past Surgical History:  Procedure Laterality Date  . NO PAST SURGERIES     Social History   Social History  . Marital status: Single    Spouse name: N/A  . Number of children: N/A  . Years of education: N/A   Social History Main Topics  . Smoking status: Current Every Day Smoker    Packs/day: 0.50    Years: 25.00    Types: Cigarettes  . Smokeless tobacco: Never Used  . Alcohol use Yes     Comment: 2 times a month - 2 beers  . Drug use: No  . Sexual activity: Not on file   Other Topics Concern  . Not on file   Social History Narrative  . No narrative on file   No Known Allergies Family History  Problem Relation Age of Onset  . Cancer Mother       Breast  . Alzheimer's disease Paternal Grandmother   . Diabetes Paternal Grandmother     Past medical history, social, surgical and family history all reviewed in electronic medical record.  No pertanent information unless stated regarding to the chief complaint.   Review of Systems:Review of systems updated and as accurate as of 05/06/16  No headache, visual changes, nausea, vomiting, diarrhea, constipation, dizziness, abdominal pain, skin rash, fevers, chills, night sweats, weight loss, swollen lymph nodes, body aches, joint swelling,, chest pain, shortness of breath, mood changes.   Objective  Blood pressure 110/80, pulse 88, height 6\' 5"  (1.956 m), weight 256 lb (116.1 kg). Systems examined below as of 05/06/16   General: No apparent distress alert and oriented x3 mood and affect normal, dressed appropriately.  HEENT: Pupils equal, extraocular movements intact  Respiratory: Patient's speak in full sentences and does not appear short of breath  Cardiovascular: No lower extremity edema, non tender, no erythema  Skin: Warm dry intact with no signs of infection or rash on extremities or on axial skeleton.  Abdomen: Soft nontender  Neuro: Cranial nerves II through XII are intact, neurovascularly intact in all extremities with 2+ DTRs and 2+ pulses.  Lymph: No lymphadenopathy of posterior or anterior cervical  chain or axillae bilaterally.  Gait normal with good balance and coordination.  MSK:  Non tender with full range of motion and good stability and symmetric strength and tone of shoulders, elbows, wrist, hip, knee and ankles bilaterally.  Back Exam:  Inspection: Unremarkable  Motion: Flexion 35 deg, Extension 20 deg, Side Bending to 25 deg bilaterally,  Rotation to 45 deg bilaterally  SLR laying: Negative  XSLR laying: Negative  Palpable tenderness: Mild tenderness of the left sacroiliac joint. Significant tightness of the hip flexors bilaterally.Marland Kitchen. FABER: Severe tightness  bilaterally. Patient also has significant decrease in internal rotation of the hips bilaterally. Sensory change: Gross sensation intact to all lumbar and sacral dermatomes.  Reflexes: 2+ at both patellar tendons, 2+ at achilles tendons, Babinski's downgoing.  Strength at foot  Plantar-flexion: 5/5 Dorsi-flexion: 5/5 Eversion: 5/5 Inversion: 5/5  Leg strength  Quad: 5/5 Hamstring: 5/5 Hip flexor: 5/5 Hip abductors: 4/5 but symmetric. Gait unremarkable.  Procedure note 97110; 15 minutes spent for Therapeutic exercises as stated in above notes.  This included exercises focusing on stretching, strengthening, with significant focus on eccentric aspects.  Low back exercises that included:  Pelvic tilt/bracing instruction to focus on control of the pelvic girdle and lower abdominal muscles  Glute strengthening exercises, focusing on proper firing of the glutes without engaging the low back muscles Proper stretching techniques for maximum relief for the hamstrings, hip flexors, low back and some rotation where tolerated   Proper technique shown and discussed handout in great detail with ATC.  All questions were discussed and answered.     Impression and Recommendations:     This case required medical decision making of moderate complexity.      Note: This dictation was prepared with Dragon dictation along with smaller phrase technology. Any transcriptional errors that result from this process are unintentional.

## 2016-05-06 ENCOUNTER — Ambulatory Visit (INDEPENDENT_AMBULATORY_CARE_PROVIDER_SITE_OTHER)
Admission: RE | Admit: 2016-05-06 | Discharge: 2016-05-06 | Disposition: A | Discharge status: Home / Self Care | Payer: Managed Care, Other (non HMO) | Source: Ambulatory Visit | Attending: Family Medicine | Admitting: Family Medicine

## 2016-05-06 ENCOUNTER — Ambulatory Visit (INDEPENDENT_AMBULATORY_CARE_PROVIDER_SITE_OTHER): Payer: Managed Care, Other (non HMO) | Admitting: Family Medicine

## 2016-05-06 ENCOUNTER — Encounter: Payer: Self-pay | Admitting: Family Medicine

## 2016-05-06 VITALS — BP 110/80 | HR 88 | Ht 77.0 in | Wt 256.0 lb

## 2016-05-06 DIAGNOSIS — M545 Low back pain, unspecified: Secondary | ICD-10-CM

## 2016-05-06 DIAGNOSIS — M25559 Pain in unspecified hip: Secondary | ICD-10-CM

## 2016-05-06 DIAGNOSIS — G8929 Other chronic pain: Secondary | ICD-10-CM

## 2016-05-06 MED ORDER — IBUPROFEN-FAMOTIDINE 800-26.6 MG PO TABS: ORAL_TABLET | ORAL | 3 refills | Status: AC

## 2016-05-06 NOTE — Patient Instructions (Signed)
Good to see you  Ice 20 minutes 2 times daily. Usually after activity and before bed..zexe Duexis up to 3 times daily  Xray of pelvis downstairs.  Vitamin D 2000 Iu daily  Iron 65mg  daily with 500mg  of vitamin C Exercises 3 times a week.  See me again in 3-4 weeks.

## 2016-05-06 NOTE — Assessment & Plan Note (Addendum)
I believe the patient does have significant tightness of that hip flexors. I do not believe the patient is 72 radicular symptoms. Back x-rays were reviewed by me today and show any significant arthritis. Oral anti-inflammatories given, home exercises given, we discussed icing regimen. Discussed muscle imbalances in great detail. Patient will try to be active. Follow-up with me again in 4 weeks.  Patient was also found to have significant limited range of motion of the hips bilaterally and we will get x-rays to further evaluate for arthritis.

## 2016-05-11 ENCOUNTER — Ambulatory Visit (INDEPENDENT_AMBULATORY_CARE_PROVIDER_SITE_OTHER): Payer: Managed Care, Other (non HMO) | Admitting: Internal Medicine

## 2016-05-11 ENCOUNTER — Encounter: Payer: Self-pay | Admitting: Internal Medicine

## 2016-05-11 ENCOUNTER — Other Ambulatory Visit (INDEPENDENT_AMBULATORY_CARE_PROVIDER_SITE_OTHER): Payer: Managed Care, Other (non HMO)

## 2016-05-11 ENCOUNTER — Other Ambulatory Visit: Payer: Managed Care, Other (non HMO)

## 2016-05-11 VITALS — BP 108/78 | HR 75 | Temp 97.8°F | Resp 16 | Wt 255.0 lb

## 2016-05-11 DIAGNOSIS — R739 Hyperglycemia, unspecified: Secondary | ICD-10-CM

## 2016-05-11 DIAGNOSIS — Z114 Encounter for screening for human immunodeficiency virus [HIV]: Secondary | ICD-10-CM

## 2016-05-11 DIAGNOSIS — Z0000 Encounter for general adult medical examination without abnormal findings: Secondary | ICD-10-CM

## 2016-05-11 LAB — COMPREHENSIVE METABOLIC PANEL
ALK PHOS: 48 U/L (ref 39–117)
ALT: 35 U/L (ref 0–53)
AST: 22 U/L (ref 0–37)
Albumin: 4.7 g/dL (ref 3.5–5.2)
BUN: 13 mg/dL (ref 6–23)
CO2: 29 meq/L (ref 19–32)
Calcium: 9.6 mg/dL (ref 8.4–10.5)
Chloride: 106 mEq/L (ref 96–112)
Creatinine, Ser: 1.16 mg/dL (ref 0.40–1.50)
GFR: 88.21 mL/min (ref >60.00)
Glucose, Bld: 114 mg/dL — ABNORMAL HIGH (ref 70–99)
POTASSIUM: 4.4 meq/L (ref 3.5–5.1)
SODIUM: 138 meq/L (ref 135–145)
TOTAL PROTEIN: 7.9 g/dL (ref 6.0–8.3)
Total Bilirubin: 0.6 mg/dL (ref 0.2–1.2)

## 2016-05-11 LAB — CBC WITH DIFFERENTIAL/PLATELET
Basophils Absolute: 0 10*3/uL (ref 0.0–0.1)
Basophils Relative: 1.1 % (ref 0.0–3.0)
EOS PCT: 2.5 % (ref 0.0–5.0)
Eosinophils Absolute: 0.1 10*3/uL (ref 0.0–0.7)
HCT: 46.4 % (ref 39.0–52.0)
Hemoglobin: 15.5 g/dL (ref 13.0–17.0)
LYMPHS ABS: 1.7 10*3/uL (ref 0.7–4.0)
Lymphocytes Relative: 42.2 % (ref 12.0–46.0)
MCHC: 33.4 g/dL (ref 30.0–36.0)
MCV: 91.5 fl (ref 78.0–100.0)
MONO ABS: 0.5 10*3/uL (ref 0.1–1.0)
MONOS PCT: 11.7 % (ref 3.0–12.0)
NEUTROS ABS: 1.7 10*3/uL (ref 1.4–7.7)
NEUTROS PCT: 42.5 % — AB (ref 43.0–77.0)
PLATELETS: 211 10*3/uL (ref 150.0–400.0)
RBC: 5.07 Mil/uL (ref 4.22–5.81)
RDW: 12.8 % (ref 11.5–15.5)
WBC: 3.9 10*3/uL — ABNORMAL LOW (ref 4.0–10.5)

## 2016-05-11 LAB — LIPID PANEL
CHOLESTEROL: 161 mg/dL (ref 0–200)
HDL: 45.7 mg/dL (ref >39.00)
LDL Cholesterol: 100 mg/dL — ABNORMAL HIGH (ref 0–99)
NONHDL: 115.71
Total CHOL/HDL Ratio: 4
Triglycerides: 81 mg/dL (ref 0.0–149.0)
VLDL: 16.2 mg/dL (ref 0.0–40.0)

## 2016-05-11 LAB — TSH: TSH: 0.42 u[IU]/mL (ref 0.35–4.50)

## 2016-05-11 LAB — HEMOGLOBIN A1C: HEMOGLOBIN A1C: 4.7 % (ref 4.6–6.5)

## 2016-05-11 LAB — HIV ANTIBODY (ROUTINE TESTING W REFLEX): HIV: NONREACTIVE

## 2016-05-11 LAB — VITAMIN B12: VITAMIN B 12: 341 pg/mL (ref 211–911)

## 2016-05-11 NOTE — Assessment & Plan Note (Signed)
Check a1c 

## 2016-05-11 NOTE — Progress Notes (Signed)
Pre visit review using our clinic review tool, if applicable. No additional management support is needed unless otherwise documented below in the visit note. 

## 2016-05-11 NOTE — Patient Instructions (Addendum)
Test(s) ordered today. Your results will be released to MyChart (or called to you) after review, usually within 72hours after test completion. If any changes need to be made, you will be notified at that same time.  All other Health Maintenance issues reviewed.   All recommended immunizations and age-appropriate screenings are up-to-date or discussed.  No immunizations administered today.   Medications reviewed and updated.  No changes recommended at this time.   Please followup in one year for a physical exam   Health Maintenance, Male A healthy lifestyle and preventative care can promote health and wellness.  Maintain regular health, dental, and eye exams.  Eat a healthy diet. Foods like vegetables, fruits, whole grains, low-fat dairy products, and lean protein foods contain the nutrients you need and are low in calories. Decrease your intake of foods high in solid fats, added sugars, and salt. Get information about a proper diet from your health care provider, if necessary.  Regular physical exercise is one of the most important things you can do for your health. Most adults should get at least 150 minutes of moderate-intensity exercise (any activity that increases your heart rate and causes you to sweat) each week. In addition, most adults need muscle-strengthening exercises on 2 or more days a week.   Maintain a healthy weight. The body mass index (BMI) is a screening tool to identify possible weight problems. It provides an estimate of body fat based on height and weight. Your health care provider can find your BMI and can help you achieve or maintain a healthy weight. For males 20 years and older:  A BMI below 18.5 is considered underweight.  A BMI of 18.5 to 24.9 is normal.  A BMI of 25 to 29.9 is considered overweight.  A BMI of 30 and above is considered obese.  Maintain normal blood lipids and cholesterol by exercising and minimizing your intake of saturated fat. Eat a  balanced diet with plenty of fruits and vegetables. Blood tests for lipids and cholesterol should begin at age 45 and be repeated every 5 years. If your lipid or cholesterol levels are high, you are over age 52, or you are at high risk for heart disease, you may need your cholesterol levels checked more frequently.Ongoing high lipid and cholesterol levels should be treated with medicines if diet and exercise are not working.  If you smoke, find out from your health care provider how to quit. If you do not use tobacco, do not start.  Lung cancer screening is recommended for adults aged 55-80 years who are at high risk for developing lung cancer because of a history of smoking. A yearly low-dose CT scan of the lungs is recommended for people who have at least a 30-pack-year history of smoking and are current smokers or have quit within the past 15 years. A pack year of smoking is smoking an average of 1 pack of cigarettes a day for 1 year (for example, a 30-pack-year history of smoking could mean smoking 1 pack a day for 30 years or 2 packs a day for 15 years). Yearly screening should continue until the smoker has stopped smoking for at least 15 years. Yearly screening should be stopped for people who develop a health problem that would prevent them from having lung cancer treatment.  If you choose to drink alcohol, do not have more than 2 drinks per day. One drink is considered to be 12 oz (360 mL) of beer, 5 oz (150 mL) of wine,  or 1.5 oz (45 mL) of liquor.  Avoid the use of street drugs. Do not share needles with anyone. Ask for help if you need support or instructions about stopping the use of drugs.  High blood pressure causes heart disease and increases the risk of stroke. High blood pressure is more likely to develop in:  People who have blood pressure in the end of the normal range (100-139/85-89 mm Hg).  People who are overweight or obese.  People who are African American.  If you are 76-54  years of age, have your blood pressure checked every 3-5 years. If you are 41 years of age or older, have your blood pressure checked every year. You should have your blood pressure measured twice-once when you are at a hospital or clinic, and once when you are not at a hospital or clinic. Record the average of the two measurements. To check your blood pressure when you are not at a hospital or clinic, you can use:  An automated blood pressure machine at a pharmacy.  A home blood pressure monitor.  If you are 16-23 years old, ask your health care provider if you should take aspirin to prevent heart disease.  Diabetes screening involves taking a blood sample to check your fasting blood sugar level. This should be done once every 3 years after age 53 if you are at a normal weight and without risk factors for diabetes. Testing should be considered at a younger age or be carried out more frequently if you are overweight and have at least 1 risk factor for diabetes.  Colorectal cancer can be detected and often prevented. Most routine colorectal cancer screening begins at the age of 29 and continues through age 41. However, your health care provider may recommend screening at an earlier age if you have risk factors for colon cancer. On a yearly basis, your health care provider may provide home test kits to check for hidden blood in the stool. A small camera at the end of a tube may be used to directly examine the colon (sigmoidoscopy or colonoscopy) to detect the earliest forms of colorectal cancer. Talk to your health care provider about this at age 72 when routine screening begins. A direct exam of the colon should be repeated every 5-10 years through age 42, unless early forms of precancerous polyps or small growths are found.  People who are at an increased risk for hepatitis B should be screened for this virus. You are considered at high risk for hepatitis B if:  You were born in a country where  hepatitis B occurs often. Talk with your health care provider about which countries are considered high risk.  Your parents were born in a high-risk country and you have not received a shot to protect against hepatitis B (hepatitis B vaccine).  You have HIV or AIDS.  You use needles to inject street drugs.  You live with, or have sex with, someone who has hepatitis B.  You are a man who has sex with other men (MSM).  You get hemodialysis treatment.  You take certain medicines for conditions like cancer, organ transplantation, and autoimmune conditions.  Hepatitis C blood testing is recommended for all people born from [birth year] through 1963 and any individual with known risk factors for hepatitis C.  Healthy men should no longer receive prostate-specific antigen (PSA) blood tests as part of routine cancer screening. Talk to your health care provider about prostate cancer screening.  Testicular cancer screening is  not recommended for adolescents or adult males who have no symptoms. Screening includes self-exam, a health care provider exam, and other screening tests. Consult with your health care provider about any symptoms you have or any concerns you have about testicular cancer.  Practice safe sex. Use condoms and avoid high-risk sexual practices to reduce the spread of sexually transmitted infections (STIs).  You should be screened for STIs, including gonorrhea and chlamydia if:  You are sexually active and are younger than 24 years.  You are older than 24 years, and your health care provider tells you that you are at risk for this type of infection.  Your sexual activity has changed since you were last screened, and you are at an increased risk for chlamydia or gonorrhea. Ask your health care provider if you are at risk.  If you are at risk of being infected with HIV, it is recommended that you take a prescription medicine daily to prevent HIV infection. This is called pre-exposure  prophylaxis (PrEP). You are considered at risk if:  You are a man who has sex with other men (MSM).  You are a heterosexual man who is sexually active with multiple partners.  You take drugs by injection.  You are sexually active with a partner who has HIV.  Talk with your health care provider about whether you are at high risk of being infected with HIV. If you choose to begin PrEP, you should first be tested for HIV. You should then be tested every 3 months for as long as you are taking PrEP.  Use sunscreen. Apply sunscreen liberally and repeatedly throughout the day. You should seek shade when your shadow is shorter than you. Protect yourself by wearing long sleeves, pants, a wide-brimmed hat, and sunglasses year round whenever you are outdoors.  Tell your health care provider of new moles or changes in moles, especially if there is a change in shape or color. Also, tell your health care provider if a mole is larger than the size of a pencil eraser.  A one-time screening for abdominal aortic aneurysm (AAA) and surgical repair of large AAAs by ultrasound is recommended for men aged 65-75 years who are current or former smokers.  Stay current with your vaccines (immunizations). This information is not intended to replace advice given to you by your health care provider. Make sure you discuss any questions you have with your health care provider. Document Released: 09/18/2007 Document Revised: 04/12/2014 Document Reviewed: 12/24/2014 Elsevier Interactive Patient Education  2017 [employer]

## 2016-05-11 NOTE — Progress Notes (Signed)
Subjective:    Patient ID: Clayton Shannon, male    DOB: 05-30-1970, 46 y.o.   MRN: [id number]  HPI He is here for a physical exam.   He has felt better since starting the vitamins - at first he felt a little tired, but feels better overall.   He is doing his exercises regularly.    He is working on quitting smoking.   Medications and allergies reviewed with patient and updated if appropriate.  Patient Active Problem List   Diagnosis Date Noted  . Anxiety 04/26/2016  . Toe pain, right 04/26/2016  . Chronic bilateral low back pain without sciatica 04/26/2016  . Tobacco abuse 04/26/2016    Current Outpatient Prescriptions on File Prior to Visit  Medication Sig Dispense Refill  . Ibuprofen-Famotidine (DUEXIS) 800-26.6 MG TABS Take 1 tablet 3 times daily. 270 tablet 3  . Multiple Vitamin (MULTIVITAMIN WITH MINERALS) TABS tablet Take 1 tablet by mouth daily.     No current facility-administered medications on file prior to visit.     Past Medical History:  Diagnosis Date  . Anxiety     Past Surgical History:  Procedure Laterality Date  . NO PAST SURGERIES      Social History   Social History  . Marital status: Single    Spouse name: N/A  . Number of children: N/A  . Years of education: N/A   Social History Main Topics  . Smoking status: Current Every Day Smoker    Packs/day: 0.50    Years: 25.00    Types: Cigarettes  . Smokeless tobacco: Never Used  . Alcohol use Yes     Comment: 2 times a month - 2 beers  . Drug use: No  . Sexual activity: Not on file   Other Topics Concern  . Not on file   Social History Narrative  . No narrative on file    Family History  Problem Relation Age of Onset  . Cancer Mother     Breast  . Alzheimer's disease Paternal Grandmother   . Diabetes Paternal Grandmother     Review of Systems  Constitutional: Negative for appetite change, chills, fatigue, fever and unexpected weight change.  Eyes: Positive for visual  disturbance (occasional spotty  - floaters).  Respiratory: Negative for cough, shortness of breath and wheezing.   Cardiovascular: Positive for palpitations (occasional). Negative for chest pain and leg swelling.  Gastrointestinal: Negative for abdominal pain, blood in stool, constipation, diarrhea and nausea.       No gerd  Genitourinary: Negative for difficulty urinating, dysuria and hematuria.  Musculoskeletal: Positive for arthralgias.  Skin: Negative for color change and rash.  Neurological: Positive for light-headedness (infrequent). Negative for headaches.  Psychiatric/Behavioral: Negative for dysphoric mood and sleep disturbance. The patient is not nervous/anxious.        Objective:   Vitals:   05/11/16 0904  BP: 108/78  Pulse: 75  Resp: 16  Temp: 97.8 F (36.6 C)   Filed Weights   05/11/16 0904  Weight: 255 lb (115.7 kg)   Body mass index is 30.24 kg/m.  Wt Readings from Last 3 Encounters:  05/11/16 255 lb (115.7 kg)  05/06/16 256 lb (116.1 kg)  04/26/16 256 lb (116.1 kg)     Physical Exam Constitutional: He appears well-developed and well-nourished. No distress.  HENT:  Head: Normocephalic and atraumatic.  Right Ear: External ear normal.  Left Ear: External ear normal.  Mouth/Throat: Oropharynx is clear and moist.  Normal ear canals  and TM b/l  Eyes: Conjunctivae and EOM are normal.  Neck: Neck supple. No tracheal deviation present. No thyromegaly present.  No carotid bruit  Cardiovascular: Normal rate, regular rhythm, normal heart sounds and intact distal pulses.   No murmur heard. Pulmonary/Chest: Effort normal and breath sounds normal. No respiratory distress. He has no wheezes. He has no rales.  Abdominal: Soft. Bowel sounds are normal. He exhibits no distension. There is no tenderness.  Genitourinary: deferred  Musculoskeletal: He exhibits no edema.  Lymphadenopathy:   He has no cervical adenopathy.  Skin: Skin is warm and dry. He is not  diaphoretic.  darker freckles/mole on left foot - no recent change Psychiatric: He has a normal mood and affect. His behavior is normal.         Assessment & Plan:   Physical exam: Screening blood work   ordered Immunizations  Up to date  Exercise -  Regular  Weight - slightly overweight - but BMI not accurate for body build Skin  - no concerns - he has some darker freckles/moles on left foot and he will monitor for change - if changes - will need to see derm Substance abuse - working on quitting smoking  See Problem List for Assessment and Plan of chronic medical problems.  FU annually

## 2016-06-02 ENCOUNTER — Ambulatory Visit (INDEPENDENT_AMBULATORY_CARE_PROVIDER_SITE_OTHER): Payer: Managed Care, Other (non HMO) | Admitting: Family Medicine

## 2016-06-02 ENCOUNTER — Encounter: Payer: Self-pay | Admitting: Family Medicine

## 2016-06-02 DIAGNOSIS — M160 Bilateral primary osteoarthritis of hip: Secondary | ICD-10-CM

## 2016-06-02 DIAGNOSIS — G8929 Other chronic pain: Secondary | ICD-10-CM

## 2016-06-02 DIAGNOSIS — M999 Biomechanical lesion, unspecified: Secondary | ICD-10-CM | POA: Diagnosis not present

## 2016-06-02 DIAGNOSIS — M545 Low back pain: Secondary | ICD-10-CM | POA: Diagnosis not present

## 2016-06-02 NOTE — Progress Notes (Signed)
[facility] [address] [place], [place] [address] Phone: [phone] Subjective:    I'm seeing this patient by the request  of:    CC: Back and hip follow-up  [id number]  Clayton Shannon is a 46 y.o. male coming in with complaint of back and hip pain. Patient was seen previously. Concern for more of a lumbar radiculopathy but patient did have x-rays are completely unremarkable. Patient did have x-rays of the hip secondary to decreased range of motion. X-rays of the pelvis was independently visualized by me. X-ray show the patient does have mild to moderate degenerative changes. Patient was to do home exercises, icing protocol, over-the-counter medications. Patient states Doing significantly better. Feels like she has more energy. Patient states and is not having as much tightness. Still having some mild cramping. Patient states that the right still gives him some difficulty but very minimal compared weight was previously.     Past Medical History:  Diagnosis Date  . Anxiety    Past Surgical History:  Procedure Laterality Date  . NO PAST SURGERIES     Social History   Social History  . Marital status: Single    Spouse name: N/A  . Number of children: N/A  . Years of education: N/A   Social History Main Topics  . Smoking status: Current Every Day Smoker    Packs/day: 0.50    Years: 25.00    Types: Cigarettes  . Smokeless tobacco: Never Used  . Alcohol use Yes     Comment: 2 times a month - 2 beers  . Drug use: No  . Sexual activity: Not Asked   Other Topics Concern  . None   Social History Narrative  . None   No Known Allergies Family History  Problem Relation Age of Onset  . Cancer Mother     Breast  . Alzheimer's disease Paternal Grandmother   . Diabetes Paternal Grandmother     Past medical history, social, surgical and family history all reviewed in electronic medical record.  No pertanent information unless stated regarding  to the chief complaint.   Review of Systems: No headache, visual changes, nausea, vomiting, diarrhea, constipation, dizziness, abdominal pain, skin rash, fevers, chills, night sweats, weight loss, swollen lymph nodes, body aches, joint swelling, , chest pain, shortness of breath, mood changes. Positive muscle aches   Objective  Blood pressure 124/80, pulse 84, height 6\' 5"  (1.956 m), weight 256 lb (116.1 kg), SpO2 98 %.   Systems examined below as of 06/02/16 General: NAD A&O x3 mood, affect normal  HEENT: Pupils equal, extraocular movements intact no nystagmus Respiratory: not short of breath at rest or with speaking Cardiovascular: No lower extremity edema, non tender Skin: Warm dry intact with no signs of infection or rash on extremities or on axial skeleton. Abdomen: Soft nontender, no masses Neuro: Cranial nerves  intact, neurovascularly intact in all extremities with 2+ DTRs and 2+ pulses. Lymph: No lymphadenopathy appreciated today  Gait normal with good balance and coordination.  MSK: Non tender with full range of motion and good stability and symmetric strength and tone of shoulders, elbows, wrist,  knee hips and ankles bilaterally.  She mild decrease in internal range of motion of the right hip. Back Exam:  Inspection: Unremarkable  Motion: Flexion 45 deg, Extension 25 deg, Side Bending to 45 deg bilaterally,  Rotation to 45 deg bilaterally  SLR laying: Negative  XSLR laying: Negative  Palpable tenderness: Mild tenderness to palpation  in the per spinal musculature at L2-L3 bilaterally.Marland Kitchen. FABER: negative. Sensory change: Gross sensation intact to all lumbar and sacral dermatomes.  Reflexes: 2+ at both patellar tendons, 2+ at achilles tendons, Babinski's downgoing.  Strength at foot  Plantar-flexion: 5/5 Dorsi-flexion: 5/5 Eversion: 5/5 Inversion: 5/5  Leg strength  Quad: 5/5 Hamstring: 5/5 Hip flexor: 5/5 Hip abductors: 5/5  Gait unremarkable.  Osteopathic  findings Cervical C2 flexed rotated and side bent left  T3 extended rotated and side bent right inhaled third rib T7 extended rotated and side bent right  L2 flexed rotated and side bent right Sacrum right on right    Impression and Recommendations:     This case required medical decision making of moderate complexity.      Note: This dictation was prepared with Dragon dictation along with smaller phrase technology. Any transcriptional errors that result from this process are unintentional.

## 2016-06-02 NOTE — Patient Instructions (Signed)
Good to see you  Tart cherry extract any dose at night Continue all the other vitamins New exercises 3 times a week I hope the manipulation helped See me again in 4-6 weeks

## 2016-06-02 NOTE — Assessment & Plan Note (Signed)
Decision today to treat with OMT was based on Physical Exam  After verbal consent patient was treated with HVLA, ME, FPR techniques in cervical, thoracic, lumbar and sacral areas  Patient tolerated the procedure well with improvement in symptoms  Patient given exercises, stretches and lifestyle modifications  See medications in patient instructions if given  Patient will follow up in 4-6 weeks 

## 2016-06-02 NOTE — Assessment & Plan Note (Signed)
Patient is more of hip arthritis. Right greater than left. Mild in nature. We discussed patient sickle cell trait and other potential comorbidities. Patient will continue stay hydrated. Continue the vitamin supplementations. Responded well to manipulation and follow-up in 4-6 weeks.

## 2016-06-02 NOTE — Assessment & Plan Note (Signed)
Patient's back pain is likely multifactorial. Still has some muscle imbalances. Encourage him to do the exercises. Responded well to manipulation. Patient's x-rays are normal with no significant bony abnormality. Patient does have hip arthritis and we'll continue to monitor. Discussed continuing the vitamins that patient seems to be responding. We discussed icing regimen. Patient will try to make these changes and come back and see me again in 4-6 weeks.

## 2016-07-14 ENCOUNTER — Ambulatory Visit: Payer: Managed Care, Other (non HMO) | Admitting: Family Medicine

## 2017-12-04 IMAGING — CT CT ABD-PELV W/ CM
2 of 6 series · 16 of 46 positions shown, 18 images · IV contrast (iopamidol)
Comparison: None.

CLINICAL DATA: Right groin pain x 3-4 months

EXAM:
CT ABDOMEN AND PELVIS WITH CONTRAST
TECHNIQUE: Multidetector CT imaging of the abdomen and pelvis was performed
using the standard protocol following bolus administration of
intravenous contrast.
CONTRAST:  100mL [id number] IOPAMIDOL ([id number]) INJECTION 61%

[Series 2: abd/ pelvis 5.0 i30f 1 · axial · 0.96mm/px · z∈[-527,-107]mm · 13 of 100 slices shown, 15 images]
[im 8/100  soft-tissue]
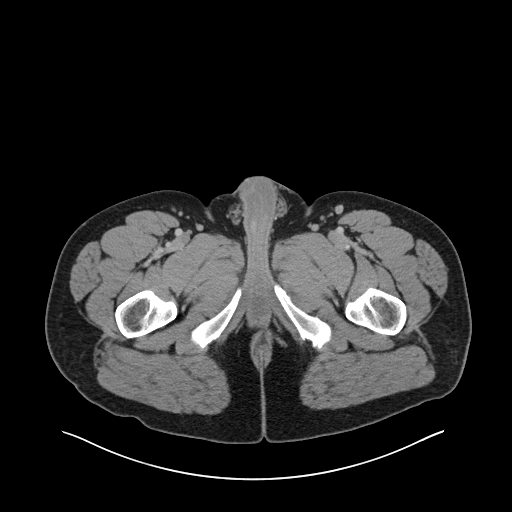
[im 8/100  bone]
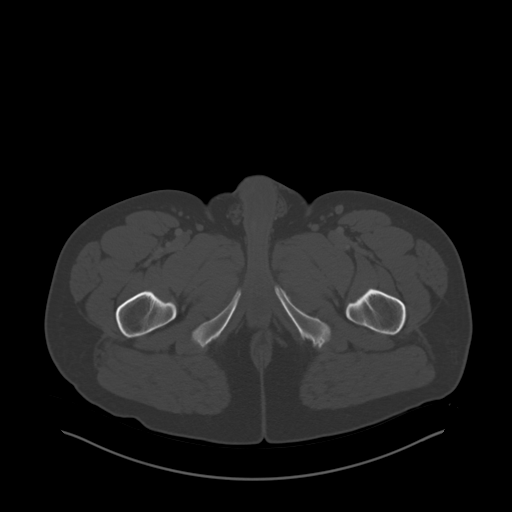
[im 15/100  soft-tissue]
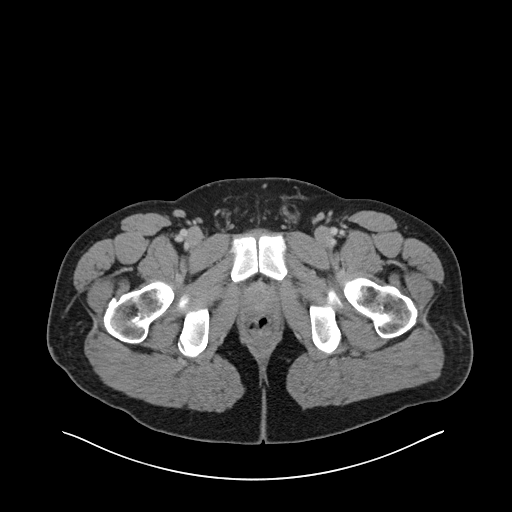
[im 22/100  soft-tissue]
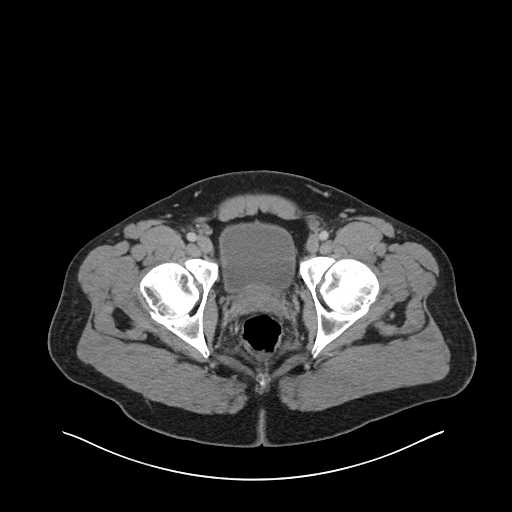
[im 29/100  soft-tissue]
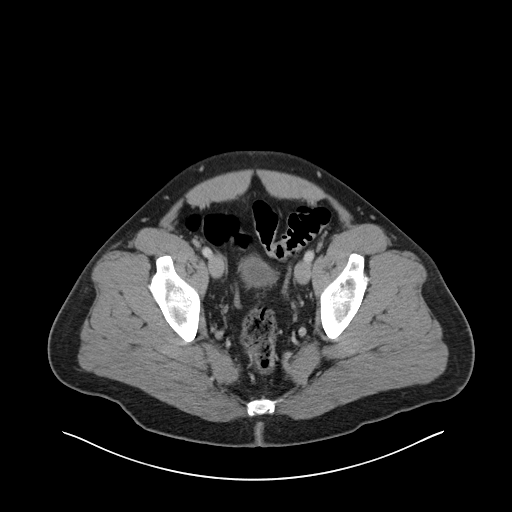
[im 36/100  soft-tissue]
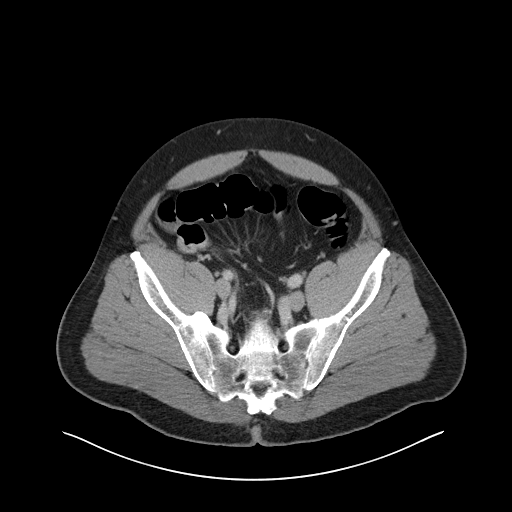
[im 43/100  soft-tissue]
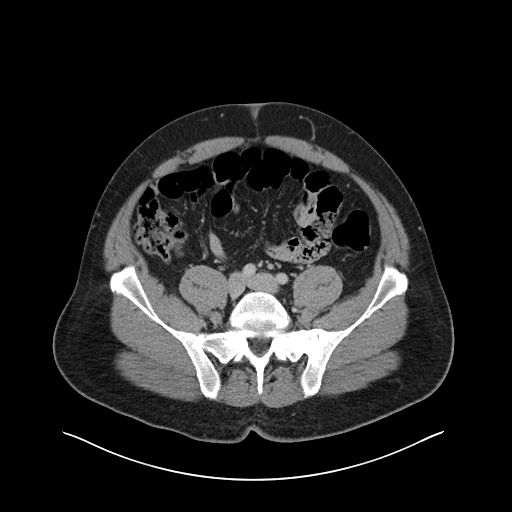
[im 50/100  soft-tissue]
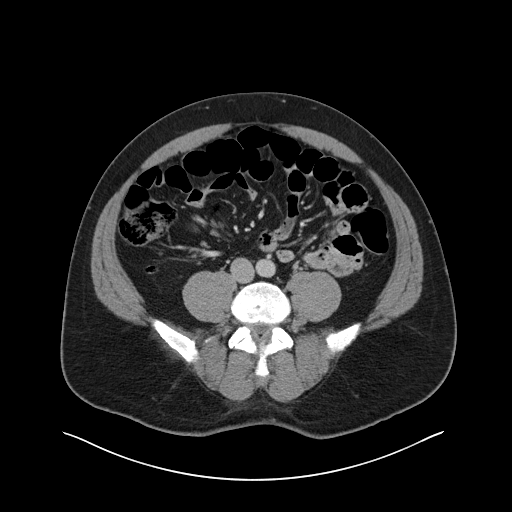
[im 57/100  soft-tissue]
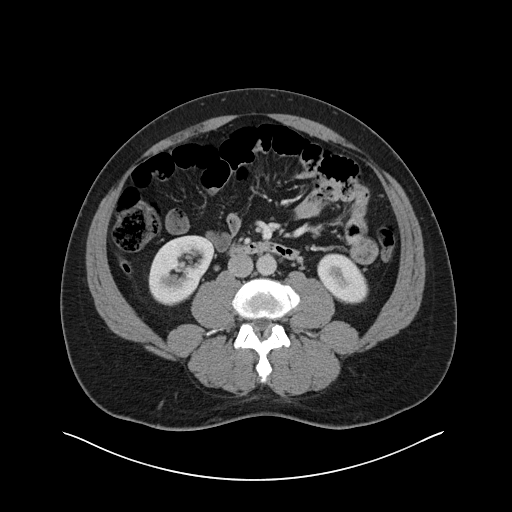
[im 64/100  soft-tissue]
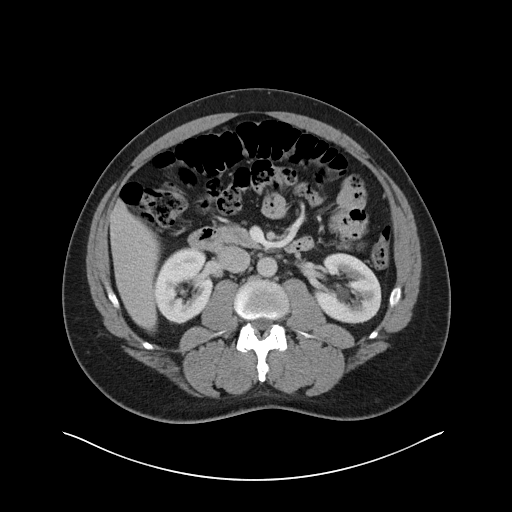
[im 64/100  bone]
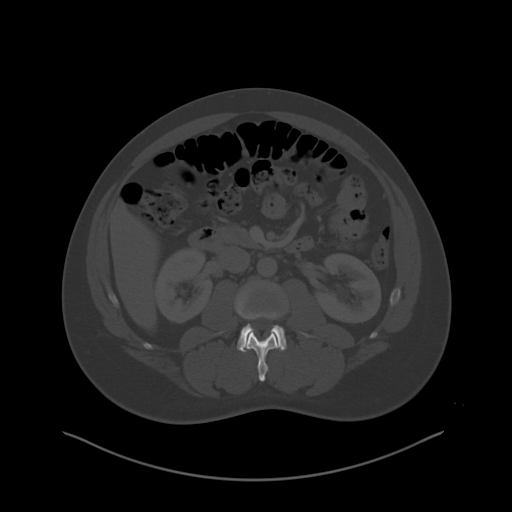
[im 71/100  soft-tissue]
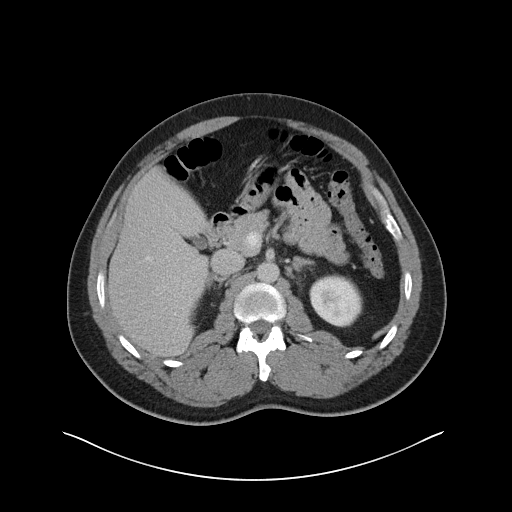
[im 78/100  soft-tissue]
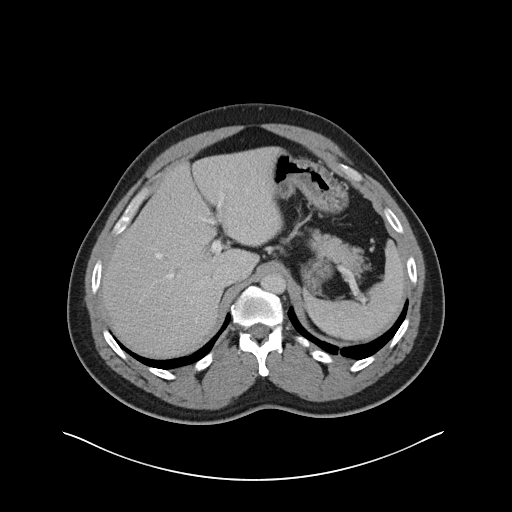
[im 85/100  soft-tissue]
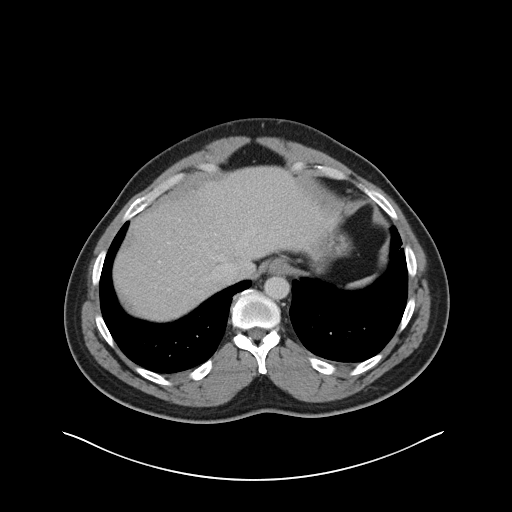
[im 92/100  soft-tissue]
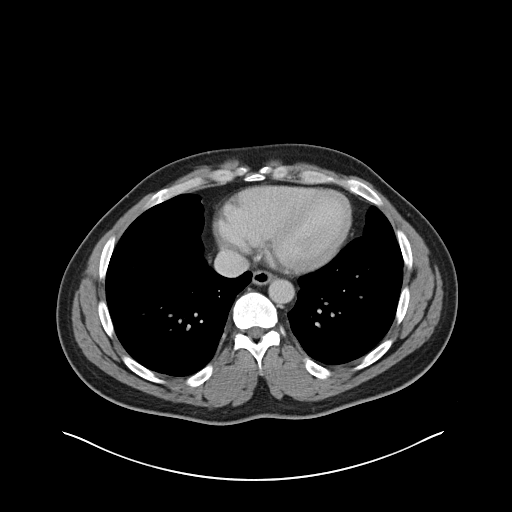

[Series 6: coronal soft tissue · coronal · 0.78mm/px · 3 of 117 slices shown]
[im 39/117  soft-tissue]
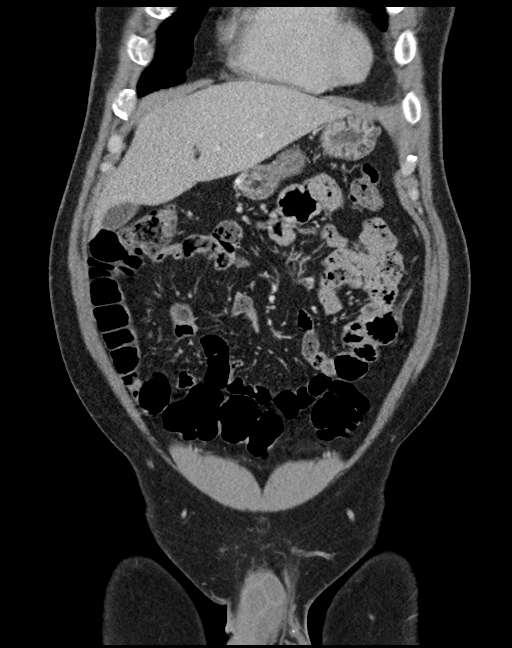
[im 52/117  soft-tissue]
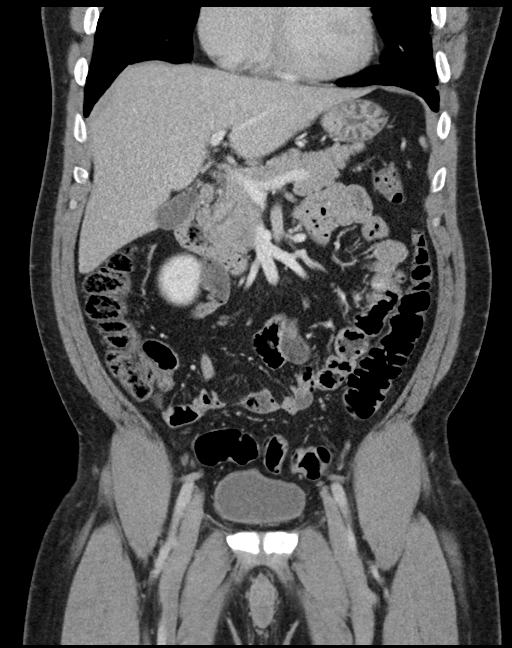
[im 65/117  soft-tissue]
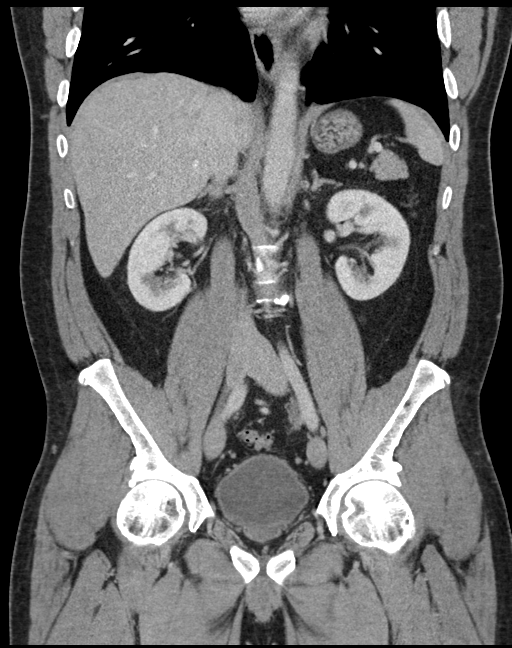

[16 of 46 positions shown; findings below may reference images not displayed]

FINDINGS: Lower chest: Lung bases are clear.

Hepatobiliary: Liver is within normal limits.

Gallbladder is unremarkable. No intrahepatic or extrahepatic ductal
dilatation.

Pancreas: Within normal limits.

Spleen: Within normal limits.

Adrenals/Urinary Tract: Adrenal glands are within normal limits.

Kidneys are within normal limits.  No hydronephrosis.

Bladder is within normal limits.

Stomach/Bowel: Stomach is within normal limits.

No evidence of bowel obstruction.

Normal appendix (series 2/ image 49).

Vascular/Lymphatic: No evidence of abdominal aortic aneurysm.

No suspicious abdominopelvic lymphadenopathy.

Reproductive: Prostate is unremarkable.

Other: No abdominopelvic ascites.

No evidence of inguinal hernia.

Musculoskeletal: Visualized osseous structures are within normal
limits.
IMPRESSION: Negative CT abdomen/pelvis.
